# Patient Record
Sex: Male | Born: 1960 | Race: White | Smoking: Never smoker
Health system: Southern US, Community
[De-identification: ages and names within clinical notes are randomized; demographics above are authoritative.]

## PROBLEM LIST (undated history)

## (undated) DIAGNOSIS — N529 Male erectile dysfunction, unspecified: Secondary | ICD-10-CM

## (undated) DIAGNOSIS — I10 Essential (primary) hypertension: Secondary | ICD-10-CM

## (undated) DIAGNOSIS — M5412 Radiculopathy, cervical region: Secondary | ICD-10-CM

## (undated) DIAGNOSIS — E785 Hyperlipidemia, unspecified: Secondary | ICD-10-CM

## (undated) DIAGNOSIS — G4700 Insomnia, unspecified: Secondary | ICD-10-CM

## (undated) HISTORY — DX: Insomnia, unspecified: G47.00

## (undated) HISTORY — DX: Hyperlipidemia, unspecified: E78.5

## (undated) HISTORY — DX: Radiculopathy, cervical region: M54.12

## (undated) HISTORY — DX: Male erectile dysfunction, unspecified: N52.9

## (undated) HISTORY — DX: Essential (primary) hypertension: I10

---
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
## 2009-11-09 IMAGING — CR DG CHEST 2V
1 series · 2 of 2 positions shown · non-contrast
Comparison: NONE

CLINICAL DATA: Follow up infiltrate. Patient is feeling better. 

CHEST TWO VIEW (PA AND LATERAL)

[Series 1: view not recorded · 0.17mm/px · 2 of 2 slices shown]
[im 1/2]
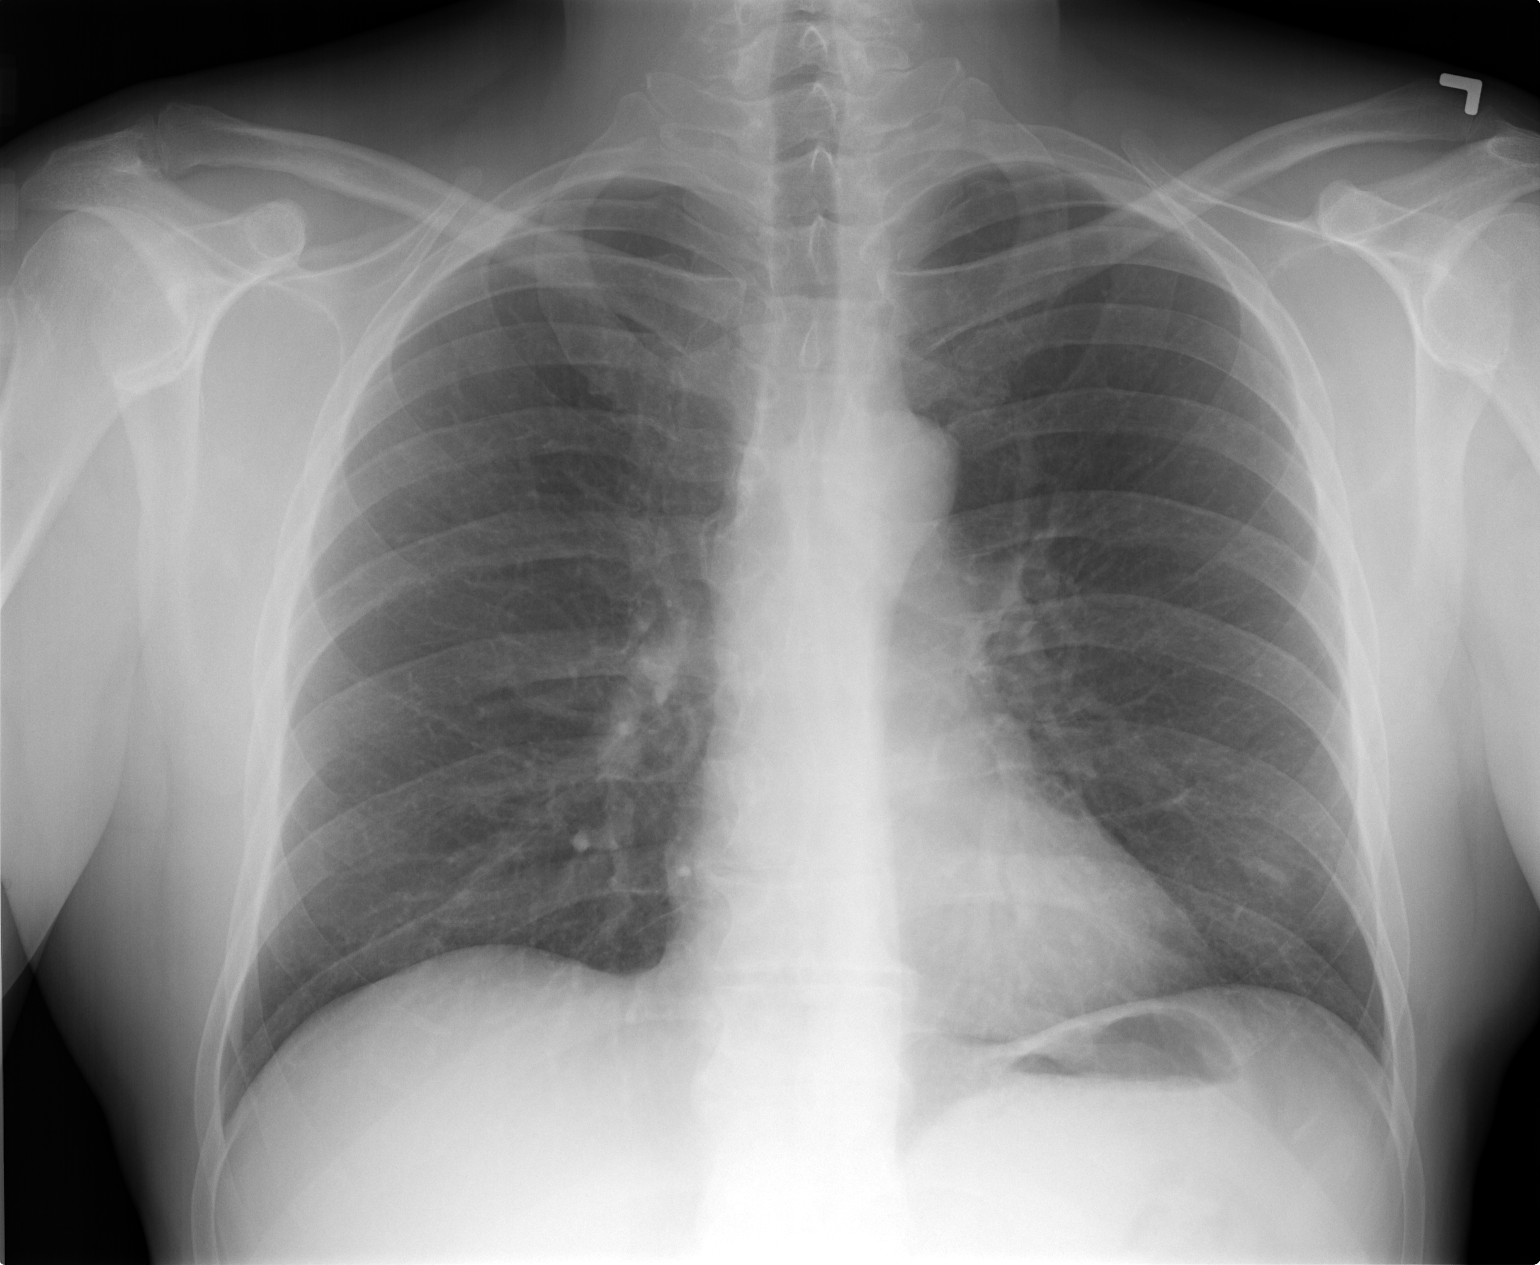
[im 2/2]
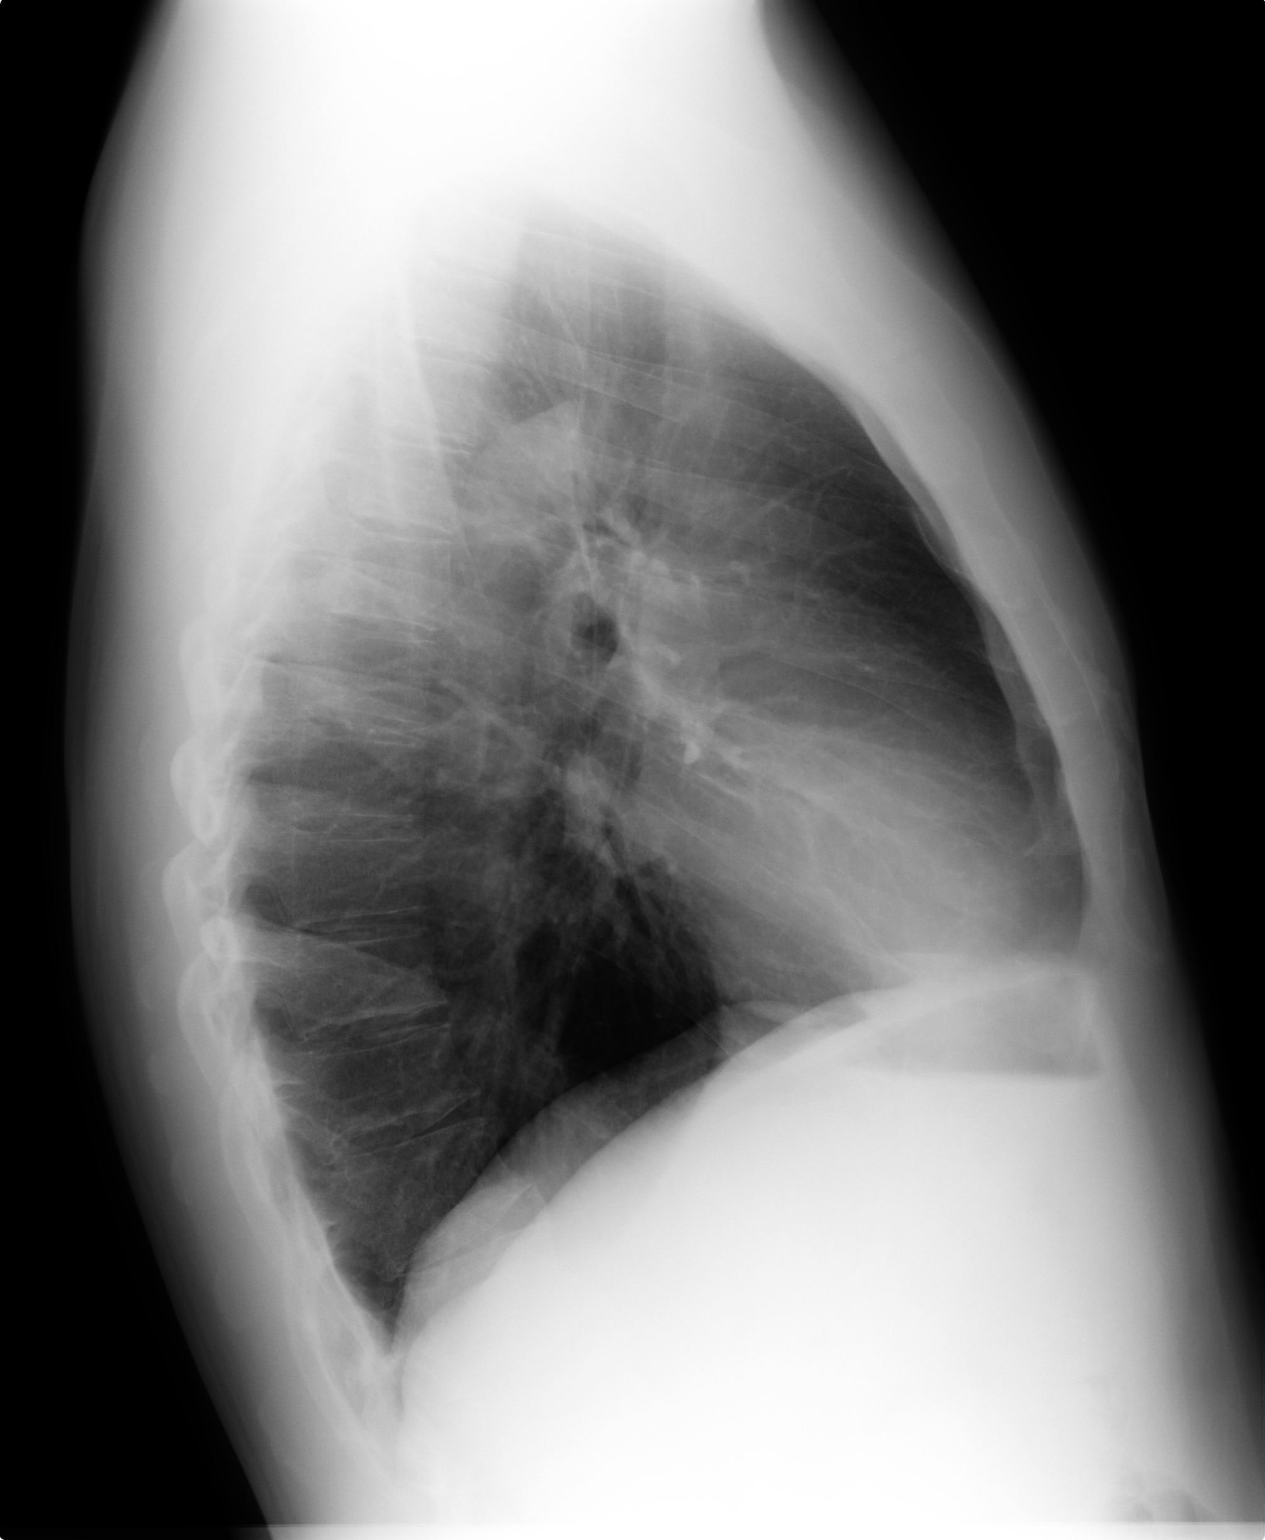

[2 of 2 positions shown; findings below may reference images not displayed]

FINDINGS: Compared to 04/13/08, the patchy infiltrate within the 
lingula has completely resolved. There are no new findings.
IMPRESSION: Resolution of lingular infiltrate. Najia Mclemore 
Erwin Javier, M.D. electronically reviewed on 05/20/2008 Dict Date: 
05/20/2008  Tran Date: 05/20/2008 CAV  [REDACTED]

## 2013-03-20 ENCOUNTER — Other Ambulatory Visit: Payer: Self-pay | Admitting: *Deleted

## 2013-03-20 MED ORDER — SILDENAFIL CITRATE 50 MG PO TABS: 50.0000 mg | ORAL_TABLET | Freq: Every day | ORAL | Status: AC | PRN

## 2013-03-30 ENCOUNTER — Encounter: Payer: Self-pay | Admitting: *Deleted

## 2013-03-31 ENCOUNTER — Ambulatory Visit: Payer: Self-pay | Admitting: Internal Medicine

## 2013-04-23 ENCOUNTER — Other Ambulatory Visit: Payer: Self-pay | Admitting: *Deleted

## 2013-04-23 MED ORDER — SIMVASTATIN 10 MG PO TABS: ORAL_TABLET | ORAL | Status: AC

## 2014-07-29 ENCOUNTER — Ambulatory Visit: Payer: Self-pay | Admitting: Podiatry

## 2014-08-02 ENCOUNTER — Encounter: Payer: Self-pay | Admitting: Podiatry

## 2014-08-02 ENCOUNTER — Ambulatory Visit (INDEPENDENT_AMBULATORY_CARE_PROVIDER_SITE_OTHER): Payer: BC Managed Care – PPO | Admitting: Podiatry

## 2014-08-02 ENCOUNTER — Ambulatory Visit (INDEPENDENT_AMBULATORY_CARE_PROVIDER_SITE_OTHER): Payer: BC Managed Care – PPO

## 2014-08-02 VITALS — BP 152/95 | HR 85 | Resp 16

## 2014-08-02 DIAGNOSIS — G589 Mononeuropathy, unspecified: Secondary | ICD-10-CM

## 2014-08-02 DIAGNOSIS — M779 Enthesopathy, unspecified: Secondary | ICD-10-CM

## 2014-08-02 NOTE — Progress Notes (Signed)
Subjective:     Patient ID: Mario Terrell, male   DOB: 09/08/1961, 53 y.o.   MRN: [id number]  HPI patient presents stating he has had some slight sensitivity issues in the bottom of both feet and it worries him that it could be a problem. He states he has not had any stumbling or weakness that is noticed this in both feet over the last month   Review of Systems     Objective:   Physical Exam Vascular status intact with mild diminishment upon vibratory but no loss of sharp Dull tactile sensation on the bottom of both feet. I checked muscle strength found to be adequate digital movement is good and digits are well-perfused    Assessment:     He has new shoes which may have created some irritation of nerve roots and cannot rule out other systemic conditions that since the symptoms are relatively new and low grade were going to put him on a watch    Plan:     Reviewed x-rays and condition with him. He will initiate soaks with Epson salts and also take Aleve for the next several weeks. If symptoms were to persist or get worse we're going to need to consider nerve conduction studies

## 2014-11-08 ENCOUNTER — Encounter: Payer: Self-pay | Admitting: Internal Medicine

## 2015-02-01 ENCOUNTER — Encounter: Payer: Self-pay | Admitting: Internal Medicine

## 2016-09-22 DIAGNOSIS — Z23 Encounter for immunization: Secondary | ICD-10-CM | POA: Diagnosis not present

## 2016-11-16 DIAGNOSIS — L821 Other seborrheic keratosis: Secondary | ICD-10-CM | POA: Diagnosis not present

## 2016-11-16 DIAGNOSIS — D2261 Melanocytic nevi of right upper limb, including shoulder: Secondary | ICD-10-CM | POA: Diagnosis not present

## 2016-11-16 DIAGNOSIS — L812 Freckles: Secondary | ICD-10-CM | POA: Diagnosis not present

## 2016-11-16 DIAGNOSIS — Z0000 Encounter for general adult medical examination without abnormal findings: Secondary | ICD-10-CM | POA: Diagnosis not present

## 2016-11-16 DIAGNOSIS — D1801 Hemangioma of skin and subcutaneous tissue: Secondary | ICD-10-CM | POA: Diagnosis not present

## 2016-11-16 DIAGNOSIS — E784 Other hyperlipidemia: Secondary | ICD-10-CM | POA: Diagnosis not present

## 2016-11-16 DIAGNOSIS — Z125 Encounter for screening for malignant neoplasm of prostate: Secondary | ICD-10-CM | POA: Diagnosis not present

## 2016-11-19 DIAGNOSIS — Z1389 Encounter for screening for other disorder: Secondary | ICD-10-CM | POA: Diagnosis not present

## 2016-11-19 DIAGNOSIS — R202 Paresthesia of skin: Secondary | ICD-10-CM | POA: Diagnosis not present

## 2016-11-19 DIAGNOSIS — Z0000 Encounter for general adult medical examination without abnormal findings: Secondary | ICD-10-CM | POA: Diagnosis not present

## 2016-11-19 DIAGNOSIS — Z6827 Body mass index (BMI) 27.0-27.9, adult: Secondary | ICD-10-CM | POA: Diagnosis not present

## 2016-11-19 DIAGNOSIS — E784 Other hyperlipidemia: Secondary | ICD-10-CM | POA: Diagnosis not present

## 2017-11-13 DIAGNOSIS — R82998 Other abnormal findings in urine: Secondary | ICD-10-CM | POA: Diagnosis not present

## 2017-11-13 DIAGNOSIS — E7849 Other hyperlipidemia: Secondary | ICD-10-CM | POA: Diagnosis not present

## 2017-11-13 DIAGNOSIS — Z125 Encounter for screening for malignant neoplasm of prostate: Secondary | ICD-10-CM | POA: Diagnosis not present

## 2017-11-20 DIAGNOSIS — Z1389 Encounter for screening for other disorder: Secondary | ICD-10-CM | POA: Diagnosis not present

## 2017-11-20 DIAGNOSIS — E7849 Other hyperlipidemia: Secondary | ICD-10-CM | POA: Diagnosis not present

## 2017-11-20 DIAGNOSIS — K219 Gastro-esophageal reflux disease without esophagitis: Secondary | ICD-10-CM | POA: Diagnosis not present

## 2017-11-20 DIAGNOSIS — Z0000 Encounter for general adult medical examination without abnormal findings: Secondary | ICD-10-CM | POA: Diagnosis not present

## 2017-11-20 DIAGNOSIS — R202 Paresthesia of skin: Secondary | ICD-10-CM | POA: Diagnosis not present

## 2017-12-13 DIAGNOSIS — D225 Melanocytic nevi of trunk: Secondary | ICD-10-CM | POA: Diagnosis not present

## 2017-12-13 DIAGNOSIS — L821 Other seborrheic keratosis: Secondary | ICD-10-CM | POA: Diagnosis not present

## 2017-12-13 DIAGNOSIS — L812 Freckles: Secondary | ICD-10-CM | POA: Diagnosis not present

## 2017-12-13 DIAGNOSIS — D1801 Hemangioma of skin and subcutaneous tissue: Secondary | ICD-10-CM | POA: Diagnosis not present

## 2018-02-03 DIAGNOSIS — E785 Hyperlipidemia, unspecified: Secondary | ICD-10-CM | POA: Diagnosis not present

## 2018-06-12 DIAGNOSIS — H353131 Nonexudative age-related macular degeneration, bilateral, early dry stage: Secondary | ICD-10-CM | POA: Diagnosis not present

## 2018-06-18 DIAGNOSIS — E7849 Other hyperlipidemia: Secondary | ICD-10-CM | POA: Diagnosis not present

## 2018-06-18 DIAGNOSIS — K219 Gastro-esophageal reflux disease without esophagitis: Secondary | ICD-10-CM | POA: Diagnosis not present

## 2018-06-18 DIAGNOSIS — Z6822 Body mass index (BMI) 22.0-22.9, adult: Secondary | ICD-10-CM | POA: Diagnosis not present

## 2018-07-01 ENCOUNTER — Encounter: Payer: Self-pay | Admitting: Internal Medicine

## 2018-07-02 DIAGNOSIS — E7849 Other hyperlipidemia: Secondary | ICD-10-CM | POA: Diagnosis not present

## 2018-08-28 ENCOUNTER — Encounter: Payer: Self-pay | Admitting: Internal Medicine

## 2018-08-28 ENCOUNTER — Other Ambulatory Visit: Payer: Self-pay

## 2018-08-28 ENCOUNTER — Telehealth: Payer: Self-pay | Admitting: *Deleted

## 2018-08-28 ENCOUNTER — Ambulatory Visit (AMBULATORY_SURGERY_CENTER): Payer: Self-pay

## 2018-08-28 VITALS — Ht 70.5 in | Wt 161.2 lb

## 2018-08-28 DIAGNOSIS — Z1211 Encounter for screening for malignant neoplasm of colon: Secondary | ICD-10-CM

## 2018-08-28 MED ORDER — NA SULFATE-K SULFATE-MG SULF 17.5-3.13-1.6 GM/177ML PO SOLN: 1.0000 | Freq: Once | ORAL | 0 refills | Status: AC

## 2018-08-28 NOTE — Telephone Encounter (Signed)
Hilda Lias,  I spoke with Mr. Gonzalez, outlined the anesthetic and the advantages of propofol.  I answered all his questions, seemed reassured., and will proceed with propofol.  Thanks,  Cathlyn Parsons

## 2018-08-28 NOTE — Telephone Encounter (Signed)
Letitia Libra Saw this pt in [facility] today-  He has MANY questions about sedation-  He stated he did not want Propofol as he wants to be "conscience" during his procedure.  He stated he would prefer F/V.   I explained to him that with F/V he still would not be awake, and we discussed the after effects of F/V vs Propofol, I.e. Short term memory loss , sleepiness x 24 hours, inability to work,drive x 24 hours and I told him even with F/V he will not be "awake" during the colon. He asked if he could have Propofol light that he would be conscience.   He states this is a personal preference.    Marland KitchenHe needs to discuss this with you.  Please call him at [phone].  Thanks so very Much     Hilda Lias

## 2018-08-28 NOTE — Progress Notes (Signed)
No egg or soy allergy known to patient  No issues with past sedation with any surgeries  or procedures, no intubation problems  Patient would like not to be put totally under for fear of been put to sleep. No diet pills per patient No home 02 use per patient  No blood thinners per patient  Pt denies issues with constipation  No A fib or A flutter  EMMI video sent to pt's e mail

## 2018-09-11 ENCOUNTER — Encounter: Payer: Self-pay | Admitting: Internal Medicine

## 2018-09-11 ENCOUNTER — Ambulatory Visit (AMBULATORY_SURGERY_CENTER): Payer: BLUE CROSS/BLUE SHIELD | Admitting: Internal Medicine

## 2018-09-11 VITALS — BP 133/80 | HR 66 | Temp 96.6°F | Resp 10 | Ht 70.0 in | Wt 161.0 lb

## 2018-09-11 DIAGNOSIS — Z1211 Encounter for screening for malignant neoplasm of colon: Secondary | ICD-10-CM | POA: Diagnosis not present

## 2018-09-11 MED ORDER — SODIUM CHLORIDE 0.9 % IV SOLN: 500.0000 mL | Freq: Once | INTRAVENOUS | Status: DC

## 2018-09-11 NOTE — Progress Notes (Signed)
Report given to PACU, vss 

## 2018-09-11 NOTE — Patient Instructions (Signed)
Impression/Recommendations:  Diverticulosis handout given to patient.  Resume previous diet. Continue present medications.  Repeat colonoscopy in 10 years for screening purposes.  YOU HAD AN ENDOSCOPIC PROCEDURE TODAY AT THE [facility]:   Refer to the procedure report that was given to you for any specific questions about what was found during the examination.  If the procedure report does not answer your questions, please call your gastroenterologist to clarify.  If you requested that your care partner not be given the details of your procedure findings, then the procedure report has been included in a sealed envelope for you to review at your convenience later.  YOU SHOULD EXPECT: Some feelings of bloating in the abdomen. Passage of more gas than usual.  Walking can help get rid of the air that was put into your GI tract during the procedure and reduce the bloating. If you had a lower endoscopy (such as a colonoscopy or flexible sigmoidoscopy) you may notice spotting of blood in your stool or on the toilet paper. If you underwent a bowel prep for your procedure, you may not have a normal bowel movement for a few days.  Please Note:  You might notice some irritation and congestion in your nose or some drainage.  This is from the oxygen used during your procedure.  There is no need for concern and it should clear up in a day or so.  SYMPTOMS TO REPORT IMMEDIATELY:   Following lower endoscopy (colonoscopy or flexible sigmoidoscopy):  Excessive amounts of blood in the stool  Significant tenderness or worsening of abdominal pains  Swelling of the abdomen that is new, acute  Fever of 100F or higher For urgent or emergent issues, a gastroenterologist can be reached at any hour by calling [phone].   DIET:  We do recommend a small meal at first, but then you may proceed to your regular diet.  Drink plenty of fluids but you should avoid alcoholic beverages for 24  hours.  ACTIVITY:  You should plan to take it easy for the rest of today and you should NOT DRIVE or use heavy machinery until tomorrow (because of the sedation medicines used during the test).    FOLLOW UP: Our staff will call the number listed on your records the next business day following your procedure to check on you and address any questions or concerns that you may have regarding the information given to you following your procedure. If we do not reach you, we will leave a message.  However, if you are feeling well and you are not experiencing any problems, there is no need to return our call.  We will assume that you have returned to your regular daily activities without incident.  If any biopsies were taken you will be contacted by phone or by letter within the next 1-3 weeks.  Please call us at [phone] if you have not heard about the biopsies in 3 weeks.    SIGNATURES/CONFIDENTIALITY: You and/or your care partner have signed paperwork which will be entered into your electronic medical record.  These signatures attest to the fact that that the information above on your After Visit Summary has been reviewed and is understood.  Full responsibility of the confidentiality of this discharge information lies with you and/or your care-partner. 

## 2018-09-11 NOTE — Op Note (Signed)
[facility] Patient Name: Mario Terrell Procedure Date: 09/11/2018 8:53 AM MRN: [id number] Endoscopist: Beverley Fiedler , MD Age: 57 Referring MD:  Date of Birth: 1961-02-14 Gender: Male Account #: [id number] Procedure:                Colonoscopy Indications:              Screening for colorectal malignant neoplasm, This                            is the patient's first colonoscopy Medicines:                Monitored Anesthesia Care Procedure:                Pre-Anesthesia Assessment:                           - Prior to the procedure, a History and Physical                            was performed, and patient medications and                            allergies were reviewed. The patient's tolerance of                            previous anesthesia was also reviewed. The risks                            and benefits of the procedure and the sedation                            options and risks were discussed with the patient.                            All questions were answered, and informed consent                            was obtained. Prior Anticoagulants: The patient has                            taken no previous anticoagulant or antiplatelet                            agents. ASA Grade Assessment: II - A patient with                            mild systemic disease. After reviewing the risks                            and benefits, the patient was deemed in                            satisfactory condition to undergo the procedure.  After obtaining informed consent, the colonoscope                            was passed under direct vision. Throughout the                            procedure, the patient's blood pressure, pulse, and                            oxygen saturations were monitored continuously. The                            Colonoscope was introduced through the anus and                            advanced to the cecum, identified  by appendiceal                            orifice and ileocecal valve. The colonoscopy was                            performed without difficulty. The patient tolerated                            the procedure well. The quality of the bowel                            preparation was good. The ileocecal valve,                            appendiceal orifice, and rectum were photographed. Scope In: 8:57:52 AM Scope Out: 9:19:02 AM Scope Withdrawal Time: 0 hours 15 minutes 58 seconds  Total Procedure Duration: 0 hours 21 minutes 10 seconds  Findings:                 The digital rectal exam was normal.                           A few small-mouthed diverticula were found in the                            sigmoid colon.                           The exam was otherwise without abnormality on                            direct and retroflexion views. Complications:            No immediate complications. Estimated Blood Loss:     Estimated blood loss: none. Impression:               - Diverticulosis in the sigmoid colon.                           - The examination was otherwise  normal on direct                            and retroflexion views.                           - No specimens collected. Recommendation:           - Patient has a contact number available for                            emergencies. The signs and symptoms of potential                            delayed complications were discussed with the                            patient. Return to normal activities tomorrow.                            Written discharge instructions were provided to the                            patient.                           - Resume previous diet.                           - Continue present medications.                           - Repeat colonoscopy in 10 years for screening                            purposes. Beverley Fiedler, MD 09/11/2018 9:21:40 AM This report has been signed electronically.

## 2018-09-11 NOTE — Progress Notes (Signed)
Pt's states no medical or surgical changes since previsit or office visit.  No egg or soy allergy  

## 2018-09-12 ENCOUNTER — Telehealth: Payer: Self-pay

## 2018-09-12 NOTE — Telephone Encounter (Signed)
  Follow up Call-  Call back number 09/11/2018  Post procedure Call Back phone  # [phone]  Permission to leave phone message Yes  Some recent data might be hidden     Patient questions:  Do you have a fever, pain , or abdominal swelling? No. Pain Score  0 *  Have you tolerated food without any problems? Yes.    Have you been able to return to your normal activities? Yes.    Do you have any questions about your discharge instructions: Diet   No. Medications  No. Follow up visit  No.  Do you have questions or concerns about your Care? No.  Actions: * If pain score is 4 or above: No action needed, pain <4.

## 2018-10-20 DIAGNOSIS — Z6822 Body mass index (BMI) 22.0-22.9, adult: Secondary | ICD-10-CM | POA: Diagnosis not present

## 2018-10-20 DIAGNOSIS — M25511 Pain in right shoulder: Secondary | ICD-10-CM | POA: Diagnosis not present

## 2018-12-12 DIAGNOSIS — M25511 Pain in right shoulder: Secondary | ICD-10-CM | POA: Diagnosis not present

## 2018-12-12 DIAGNOSIS — G8929 Other chronic pain: Secondary | ICD-10-CM | POA: Diagnosis not present

## 2018-12-12 DIAGNOSIS — M7501 Adhesive capsulitis of right shoulder: Secondary | ICD-10-CM | POA: Diagnosis not present

## 2019-08-14 DIAGNOSIS — H353131 Nonexudative age-related macular degeneration, bilateral, early dry stage: Secondary | ICD-10-CM | POA: Diagnosis not present

## 2019-08-21 DIAGNOSIS — E785 Hyperlipidemia, unspecified: Secondary | ICD-10-CM | POA: Diagnosis not present

## 2019-08-21 DIAGNOSIS — Z0000 Encounter for general adult medical examination without abnormal findings: Secondary | ICD-10-CM | POA: Diagnosis not present

## 2019-08-26 DIAGNOSIS — E785 Hyperlipidemia, unspecified: Secondary | ICD-10-CM | POA: Diagnosis not present

## 2019-08-26 DIAGNOSIS — Z1331 Encounter for screening for depression: Secondary | ICD-10-CM | POA: Diagnosis not present

## 2019-08-26 DIAGNOSIS — Z23 Encounter for immunization: Secondary | ICD-10-CM | POA: Diagnosis not present

## 2019-08-26 DIAGNOSIS — Z0000 Encounter for general adult medical examination without abnormal findings: Secondary | ICD-10-CM | POA: Diagnosis not present

## 2019-08-26 DIAGNOSIS — K219 Gastro-esophageal reflux disease without esophagitis: Secondary | ICD-10-CM | POA: Diagnosis not present

## 2021-12-11 ENCOUNTER — Other Ambulatory Visit: Payer: Self-pay | Admitting: Endocrinology

## 2021-12-11 DIAGNOSIS — Z0000 Encounter for general adult medical examination without abnormal findings: Secondary | ICD-10-CM

## 2022-08-01 ENCOUNTER — Inpatient Hospital Stay: Admission: RE | Admit: 2022-08-01 | Payer: BLUE CROSS/BLUE SHIELD | Source: Ambulatory Visit

## 2022-10-18 ENCOUNTER — Other Ambulatory Visit: Payer: BLUE CROSS/BLUE SHIELD

## 2022-10-31 ENCOUNTER — Other Ambulatory Visit: Payer: Self-pay
